# Patient Record
Sex: Male | Born: 1990 | Race: White | Hispanic: No | Marital: Single
Health system: Southern US, Community
[De-identification: ages and names within clinical notes are randomized; demographics above are authoritative.]

---
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
## 2016-04-23 ENCOUNTER — Ambulatory Visit (INDEPENDENT_AMBULATORY_CARE_PROVIDER_SITE_OTHER): Payer: BLUE CROSS/BLUE SHIELD | Admitting: Podiatry

## 2016-04-23 ENCOUNTER — Ambulatory Visit (INDEPENDENT_AMBULATORY_CARE_PROVIDER_SITE_OTHER): Payer: BLUE CROSS/BLUE SHIELD

## 2016-04-23 ENCOUNTER — Encounter: Payer: Self-pay | Admitting: Podiatry

## 2016-04-23 VITALS — BP 124/70 | HR 91 | Resp 16

## 2016-04-23 DIAGNOSIS — M2010 Hallux valgus (acquired), unspecified foot: Secondary | ICD-10-CM | POA: Diagnosis not present

## 2016-04-23 NOTE — Progress Notes (Signed)
   Subjective:    Patient ID: Jonathan Blanchard, male    DOB: 05/29/1990, 26 y.o.   MRN: [id number]  HPI: He presents today with a chief complaint of pain to the first metatarsophalangeal joint bilaterally left greater than right. Studies there's been aching for the past several years and seems to be getting worse particularly recently. His mother presents with him today and states that she was with him when he was here as a teenager and they were not nearly as bad as they are now. He was provided with some inserts at that time slow the progression. He states that he develops callusing beneath the first metatarsophalangeal joint which feels numb on occasion. He states that he is on his feet all day as a [employer] and they're extremely painful by the end of the day.    Review of Systems  All other systems reviewed and are negative.      Objective:   Physical Exam: Vital signs are stable alert and oriented 3 no apparent distress. Pulses are strongly palpable neurologic sensorium is intact deep tendon reflexes are intact muscle strength is normal symmetrical bilateral. Orthopedic evaluation demonstrates moderate hallux abductovalgus deformities bilateral with limitation in range of motion left greater than right. No crepitation on range of motion. There is no erythema cellulitis drainage or odor. Radiographs taken today 3 views of the left foot demonstrates osseously mature foot bilaterally increase in the first intermetatarsal angle greater than normal value with hallux abductus angle greater than normal value resulting in dislocation. There is some dorsal spurring to the first metatarsophalangeal joint of the left foot over the right consistent with joint space narrowing and osteoarthritis. Cutaneous evaluation of awell-hydratedJudeisdorsallyhoweverplantaraspectofbilateralfootdoesdemonstratetylomatotheforefoot.        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  Plan: Discussed etiology pathology conservative versus surgical therapies. At this point we we discussed surgery in great detail today and he will follow-up with me in August for surgery in November. A consult will be performed at that time his Cam Dan HumphreysWalker will be dispensed a new x-rays will be taken for new angles.

## 2016-04-23 NOTE — Patient Instructions (Signed)

## 2016-10-22 ENCOUNTER — Ambulatory Visit: Payer: BLUE CROSS/BLUE SHIELD | Admitting: Podiatry
